# Patient Record
Sex: Male | Born: 1975 | Race: White | Hispanic: No | Marital: Single | Smoking: Never smoker
Health system: Southern US, Community
[De-identification: ages and names within clinical notes are randomized; demographics above are authoritative.]

## PROBLEM LIST (undated history)

## (undated) DIAGNOSIS — E785 Hyperlipidemia, unspecified: Secondary | ICD-10-CM

## (undated) DIAGNOSIS — I10 Essential (primary) hypertension: Secondary | ICD-10-CM

---

## 2004-01-30 ENCOUNTER — Ambulatory Visit: Payer: Self-pay | Admitting: Family Medicine

## 2004-05-03 ENCOUNTER — Ambulatory Visit: Payer: Self-pay | Admitting: Family Medicine

## 2004-07-01 ENCOUNTER — Ambulatory Visit: Payer: Self-pay | Admitting: Family Medicine

## 2004-10-25 ENCOUNTER — Emergency Department (HOSPITAL_COMMUNITY): Admission: EM | Admit: 2004-10-25 | Discharge: 2004-10-25 | Payer: Self-pay | Admitting: Emergency Medicine

## 2006-03-15 ENCOUNTER — Ambulatory Visit: Payer: Self-pay | Admitting: Family Medicine

## 2006-04-11 ENCOUNTER — Ambulatory Visit: Payer: Self-pay | Admitting: Family Medicine

## 2008-08-21 ENCOUNTER — Emergency Department (HOSPITAL_COMMUNITY): Admission: EM | Admit: 2008-08-21 | Discharge: 2008-08-21 | Payer: Self-pay | Admitting: Emergency Medicine

## 2010-04-18 LAB — URINALYSIS, ROUTINE W REFLEX MICROSCOPIC
Bilirubin Urine: NEGATIVE
Glucose, UA: NEGATIVE mg/dL
Ketones, ur: 40 mg/dL — AB
Leukocytes, UA: NEGATIVE
Nitrite: NEGATIVE
Protein, ur: 100 mg/dL — AB
Specific Gravity, Urine: >1.03 — ABNORMAL HIGH (ref 1.005–1.030)
Urobilinogen, UA: 0.2 mg/dL (ref 0.0–1.0)
pH: 6 (ref 5.0–8.0)

## 2010-04-18 LAB — DIFFERENTIAL
Basophils Absolute: 0 10*3/uL (ref 0.0–0.1)
Basophils Relative: 0 % (ref 0–1)
Eosinophils Absolute: 0.1 10*3/uL (ref 0.0–0.7)
Eosinophils Relative: 1 % (ref 0–5)
Lymphocytes Relative: 14 % (ref 12–46)
Lymphs Abs: 1.7 10*3/uL (ref 0.7–4.0)
Monocytes Absolute: 0.4 10*3/uL (ref 0.1–1.0)
Monocytes Relative: 3 % (ref 3–12)
Neutro Abs: 10.4 10*3/uL — ABNORMAL HIGH (ref 1.7–7.7)
Neutrophils Relative %: 82 % — ABNORMAL HIGH (ref 43–77)

## 2010-04-18 LAB — CBC
HCT: 45.7 % (ref 39.0–52.0)
Hemoglobin: 15.7 g/dL (ref 13.0–17.0)
MCHC: 34.3 g/dL (ref 30.0–36.0)
MCV: 86.5 fL (ref 78.0–100.0)
Platelets: 293 10*3/uL (ref 150–400)
RBC: 5.28 MIL/uL (ref 4.22–5.81)
RDW: 13.4 % (ref 11.5–15.5)
WBC: 12.6 10*3/uL — ABNORMAL HIGH (ref 4.0–10.5)

## 2010-04-18 LAB — BASIC METABOLIC PANEL
BUN: 10 mg/dL (ref 6–23)
CO2: 30 mEq/L (ref 19–32)
Calcium: 10 mg/dL (ref 8.4–10.5)
Chloride: 105 mEq/L (ref 96–112)
Creatinine, Ser: 1.16 mg/dL (ref 0.4–1.5)
GFR calc Af Amer: >60 mL/min (ref >60)
GFR calc non Af Amer: >60 mL/min (ref >60)
Glucose, Bld: 138 mg/dL — ABNORMAL HIGH (ref 70–99)
Potassium: 3.9 mEq/L (ref 3.5–5.1)
Sodium: 141 mEq/L (ref 135–145)

## 2010-04-18 LAB — URINE MICROSCOPIC-ADD ON

## 2018-03-14 ENCOUNTER — Encounter (HOSPITAL_COMMUNITY): Payer: Self-pay

## 2018-03-14 ENCOUNTER — Emergency Department (HOSPITAL_COMMUNITY): Payer: BLUE CROSS/BLUE SHIELD

## 2018-03-14 ENCOUNTER — Emergency Department (HOSPITAL_COMMUNITY)
Admission: EM | Admit: 2018-03-14 | Discharge: 2018-03-14 | Disposition: A | Discharge status: Home / Self Care | Payer: BLUE CROSS/BLUE SHIELD | Attending: Emergency Medicine | Admitting: Emergency Medicine

## 2018-03-14 ENCOUNTER — Other Ambulatory Visit: Payer: Self-pay

## 2018-03-14 DIAGNOSIS — I10 Essential (primary) hypertension: Secondary | ICD-10-CM | POA: Diagnosis not present

## 2018-03-14 DIAGNOSIS — R202 Paresthesia of skin: Secondary | ICD-10-CM | POA: Diagnosis not present

## 2018-03-14 DIAGNOSIS — Z79899 Other long term (current) drug therapy: Secondary | ICD-10-CM | POA: Insufficient documentation

## 2018-03-14 DIAGNOSIS — R079 Chest pain, unspecified: Secondary | ICD-10-CM | POA: Diagnosis not present

## 2018-03-14 DIAGNOSIS — R519 Headache, unspecified: Secondary | ICD-10-CM

## 2018-03-14 DIAGNOSIS — R51 Headache: Secondary | ICD-10-CM | POA: Diagnosis not present

## 2018-03-14 HISTORY — DX: Hyperlipidemia, unspecified: E78.5

## 2018-03-14 HISTORY — DX: Essential (primary) hypertension: I10

## 2018-03-14 LAB — CBC
HCT: 48.4 % (ref 39.0–52.0)
Hemoglobin: 15.6 g/dL (ref 13.0–17.0)
MCH: 27.7 pg (ref 26.0–34.0)
MCHC: 32.2 g/dL (ref 30.0–36.0)
MCV: 86 fL (ref 80.0–100.0)
Platelets: 309 K/uL (ref 150–400)
RBC: 5.63 MIL/uL (ref 4.22–5.81)
RDW: 12.6 % (ref 11.5–15.5)
WBC: 6.5 K/uL (ref 4.0–10.5)
nRBC: 0 % (ref 0.0–0.2)

## 2018-03-14 LAB — BASIC METABOLIC PANEL WITH GFR
Anion gap: 10 (ref 5–15)
BUN: 13 mg/dL (ref 6–20)
CO2: 26 mmol/L (ref 22–32)
Calcium: 9.4 mg/dL (ref 8.9–10.3)
Chloride: 101 mmol/L (ref 98–111)
Creatinine, Ser: 0.89 mg/dL (ref 0.61–1.24)
GFR calc Af Amer: >60 mL/min
GFR calc non Af Amer: >60 mL/min
Glucose, Bld: 126 mg/dL — ABNORMAL HIGH (ref 70–99)
Potassium: 4.2 mmol/L (ref 3.5–5.1)
Sodium: 137 mmol/L (ref 135–145)

## 2018-03-14 LAB — TROPONIN I: Troponin I: <0.03 ng/mL (ref <0.03)

## 2018-03-14 MED ORDER — KETOROLAC TROMETHAMINE 30 MG/ML IJ SOLN
15.0000 mg | Freq: Once | INTRAMUSCULAR | Status: AC
  Administered 2018-03-14: 15 mg via INTRAVENOUS
  Filled 2018-03-14: qty 1

## 2018-03-14 MED ORDER — PROMETHAZINE HCL 25 MG/ML IJ SOLN
12.5000 mg | Freq: Once | INTRAMUSCULAR | Status: AC
  Administered 2018-03-14: 12.5 mg via INTRAVENOUS
  Filled 2018-03-14: qty 1

## 2018-03-14 MED ORDER — SODIUM CHLORIDE 0.9% FLUSH
3.0000 mL | Freq: Once | INTRAVENOUS | Status: AC
  Administered 2018-03-14: 3 mL via INTRAVENOUS

## 2018-03-14 NOTE — ED Triage Notes (Addendum)
Pt reports he woke up approx 0530 am with chest pain and facial pain. Chest hurts under left breast area. Left arm feels numb. Pt neuro's wnl

## 2018-03-14 NOTE — ED Provider Notes (Signed)
[facility] EMERGENCY DEPARTMENT Provider Note   CSN: [id number] Arrival date & time: 03/14/18  1208    History   Chief Complaint Chief Complaint  Patient presents with  . Chest Pain  . Facial Pain    HPI Barry Stevens is a 43 y.o. male.     HPI Patient presents with chest pain and facial pain.  States the pain hurts on the left side of his head.  Throbbing.  Dull.  States the left face also feels numb.  Has not had headaches like this before.  No trauma.  No vision changes.  No fevers. Also has left mid chest pain.  Also dull.  No difficulty with breathing.  No fevers.  No cough.  No shortness of breath.  States he has some numbness in his left arm but states this is chronic.  No swelling in his legs.  History of hyperlipidemia and high cholesterol.  Past Medical History:  Diagnosis Date  . Hyperlipidemia   . Hypertension     There are no active problems to display for this patient.   History reviewed. No pertinent surgical history.      Home Medications    Prior to Admission medications   Medication Sig Start Date End Date Taking? Authorizing Provider  atorvastatin (LIPITOR) 40 MG tablet Take 1 tablet by mouth daily. 01/12/18  Yes [provider]  diclofenac sodium (VOLTAREN) 1 % GEL Place 1 application onto the skin 2 (two) times daily. 10/23/17  Yes [provider]  olmesartan-hydrochlorothiazide (BENICAR HCT) 20-12.5 MG tablet Take 1 tablet by mouth daily. 03/13/18  Yes [provider]  omeprazole (PRILOSEC) 20 MG capsule Take 1 capsule by mouth 2 (two) times daily. 01/12/18  Yes [provider]    Family History No family history on file.  Social History Social History   Tobacco Use  . Smoking status: Never Smoker  Substance Use Topics  . Alcohol use: Yes    Comment: drinks daily jack and coke and budlight  . Drug use: Never     Allergies   Patient has no known allergies.   Review of Systems Review of Systems    Constitutional: Negative for appetite change.  HENT: Negative for congestion.   Respiratory: Negative for shortness of breath.   Cardiovascular: Positive for chest pain.  Gastrointestinal: Negative for abdominal distention and abdominal pain.  Genitourinary: Negative for flank pain.  Musculoskeletal: Positive for neck pain.  Skin: Negative for rash.  Neurological: Positive for numbness and headaches. Negative for weakness.  Psychiatric/Behavioral: Negative for behavioral problems.     Physical Exam Updated Vital Signs BP 126/83 (BP Location: Left Arm)   Pulse 69   Temp 98.3 F (36.8 C) (Oral)   Resp 20   Ht 5' 10"  (1.778 m)   Wt 115.7 kg   SpO2 97%   BMI 36.59 kg/m   Physical Exam Vitals signs and nursing note reviewed.  Constitutional:      Appearance: He is well-developed.  HENT:     Head: Normocephalic.  Eyes:     Extraocular Movements: Extraocular movements intact.     Pupils: Pupils are equal, round, and reactive to light.  Neck:     Musculoskeletal: Neck supple.  Cardiovascular:     Rate and Rhythm: Normal rate and regular rhythm.     Heart sounds: No murmur.  Pulmonary:     Effort: Pulmonary effort is normal.     Breath sounds: No wheezing, rhonchi or rales.  Musculoskeletal:     Right lower leg: No edema.  Skin:    General: Skin is warm.     Capillary Refill: Capillary refill takes less than 2 seconds.  Neurological:     Mental Status: He is alert.     Comments: Paresthesia to left upper and mid face.  Eye movements intact.  Good grip strength bilaterally.  Sensation intact in both upper extremities.  Good strength in face.      ED Treatments / Results  Labs (all labs ordered are listed, but only abnormal results are displayed) Labs Reviewed  BASIC METABOLIC PANEL - Abnormal; Notable for the following components:      Result Value   Glucose, Bld 126 (*)    All other components within normal limits  CBC  TROPONIN I  TROPONIN I    EKG EKG  Interpretation  Date/Time:  Wednesday March 14 2018 12:16:34 EST Ventricular Rate:  90 PR Interval:  132 QRS Duration: 80 QT Interval:  366 QTC Calculation: 447 R Axis:   58 Text Interpretation:  Normal sinus rhythm Normal ECG Confirmed by Davonna Belling [phone]) on 03/14/2018 2:06:15 PM   Radiology Dg Chest 2 View  Result Date: 03/14/2018 CLINICAL DATA:  Left-sided chest pain. EXAM: CHEST - 2 VIEW COMPARISON:  None. FINDINGS: The heart size and mediastinal contours are within normal limits. Both lungs are clear. The visualized skeletal structures are unremarkable. IMPRESSION: Normal exam. Electronically Signed   By: Lorriane Shire M.D.   On: 03/14/2018 12:56    Procedures Procedures (including critical care time)  Medications Ordered in ED Medications  sodium chloride flush (NS) 0.9 % injection 3 mL (3 mLs Intravenous Given 03/14/18 1452)  ketorolac (TORADOL) 30 MG/ML injection 15 mg (15 mg Intravenous Given 03/14/18 1451)  promethazine (PHENERGAN) injection 12.5 mg (12.5 mg Intravenous Given 03/14/18 1452)     Initial Impression / Assessment and Plan / ED Course  I have reviewed the triage vital signs and the nursing notes.  Pertinent labs & imaging results that were available during my care of the patient were reviewed by me and considered in my medical decision making (see chart for details).        Patient with headache facial paresthesias/pain.  Also chest pain.  Work-up overall reassuring.  Enzymes negative initially was second pending.  Head CT to be done due to headache with possible left neuro deficits in upper face.  However I do not think that he needs admission for stroke work-up.  Treated his migraine.  Likely discharge home.  Care turned over to Dr. Laverta Baltimore.  Final Clinical Impressions(s) / ED Diagnoses   Final diagnoses:  Nonspecific chest pain  Nonintractable headache, unspecified chronicity pattern, unspecified headache type  Paresthesia    ED Discharge Orders     None       Davonna Belling, MD 03/14/18 1512

## 2018-03-14 NOTE — Discharge Instructions (Addendum)
Call the Neurologist listed along with your PCP to schedule a follow up appointment. Return to the ED with any new or worsening symptoms.

## 2018-03-14 NOTE — ED Provider Notes (Signed)
Blood pressure 126/83, pulse 69, temperature 98.3 F (36.8 C), temperature source Oral, resp. rate 20, height 5' 10"  (1.778 m), weight 115.7 kg, SpO2 97 %.  Assuming care from Dr. Alvino Chapel.  In short, Barry Stevens is a 43 y.o. male with a chief complaint of Chest Pain and Facial Pain .  Refer to the original H&P for additional details.  The current plan of care is to f/u after CT head and repeat troponin.   04:20 PM CT imaging reviewed with no acute findings.  Repeat troponin is negative.  Reevaluated the patient is feeling well.  Neurological exam is unremarkable.  I have provided information for the patient at discharge including his PCPs contact information along with a local neurologist.  Discussed emergency department return precautions in detail.   EKG Interpretation  Date/Time:  Wednesday March 14 2018 12:16:34 EST Ventricular Rate:  90 PR Interval:  132 QRS Duration: 80 QT Interval:  366 QTC Calculation: 447 R Axis:   58 Text Interpretation:  Normal sinus rhythm Normal ECG Confirmed by Davonna Belling [phone] on 03/14/2018 2:06:15 PM          Long, W[facility], MD 03/14/18 1626

## 2019-12-12 IMAGING — CT CT HEAD W/O CM
3 series · 16 of 47 positions shown, 19 images · non-contrast
Comparison: None.

CLINICAL DATA: Chest pain, facial pain, left-sided

EXAM:
CT HEAD WITHOUT CONTRAST
TECHNIQUE: Contiguous axial images were obtained from the base of the skull
through the vertex without intravenous contrast.

[Series 2: head trauma wo · axial · 0.44mm/px · z∈[+1560,+1705]mm · 10 of 35 slices shown, 13 images]
[im 3/35  brain]
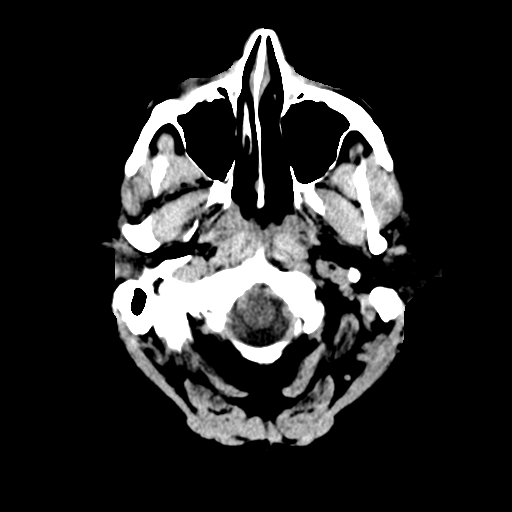
[im 3/35  bone]
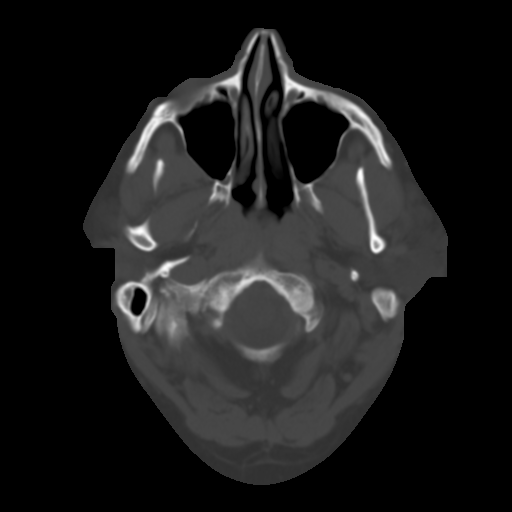
[im 6/35  brain]
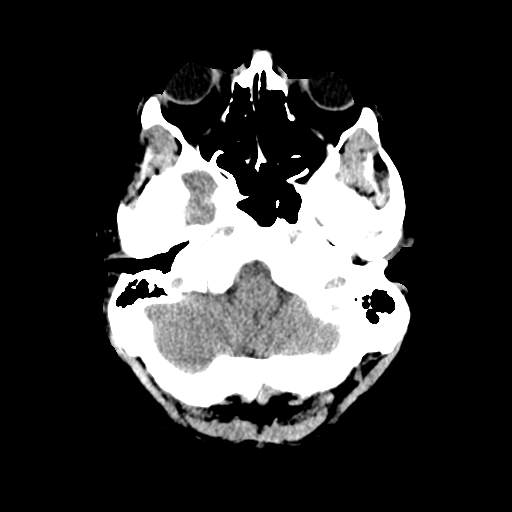
[im 10/35  brain]
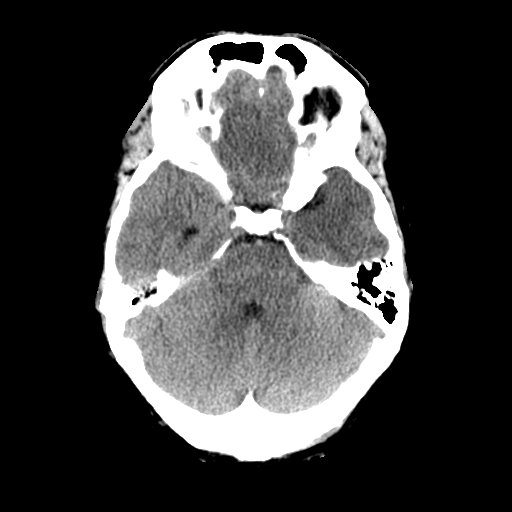
[im 12/35  brain]
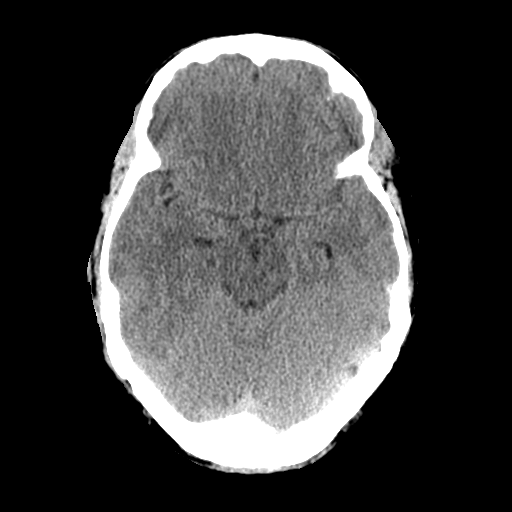
[im 16/35  brain]
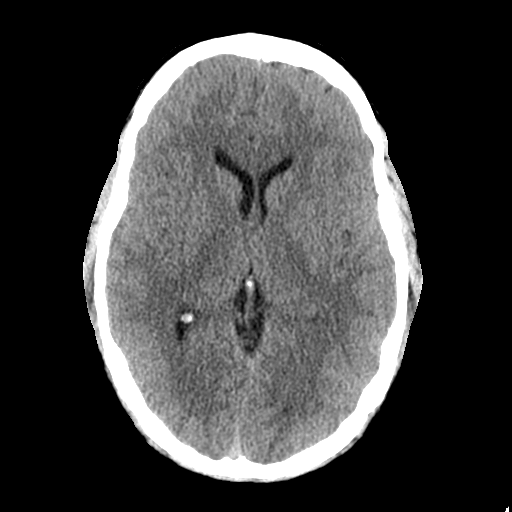
[im 16/35  bone]
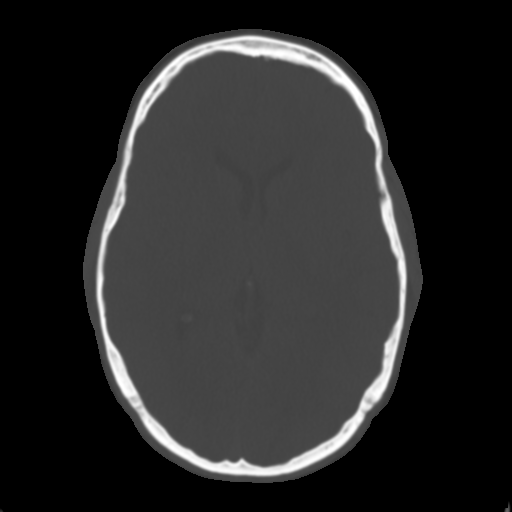
[im 19/35  brain]
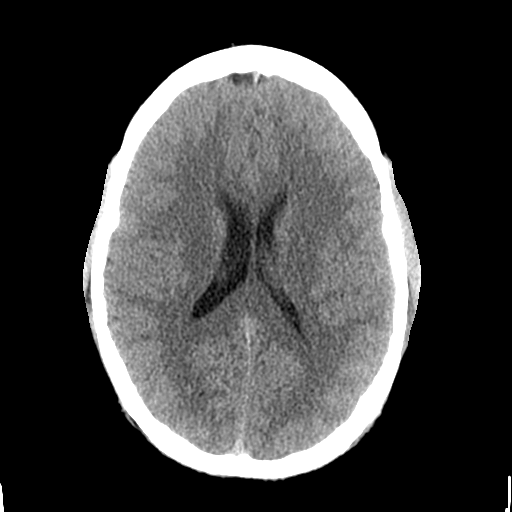
[im 23/35  brain]
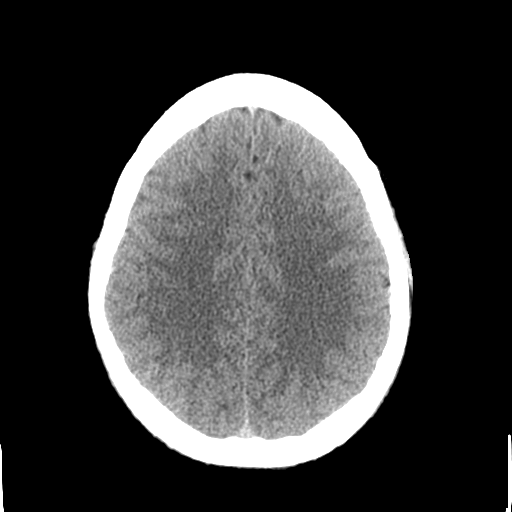
[im 26/35  brain]
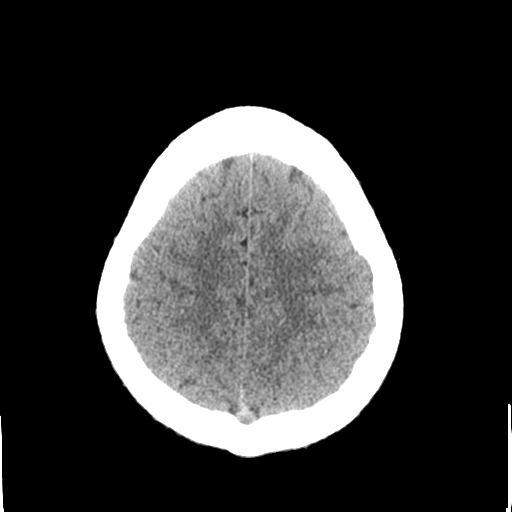
[im 29/35  brain]
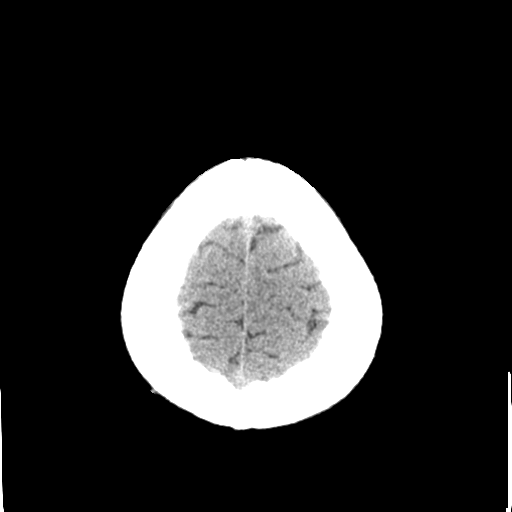
[im 29/35  bone]
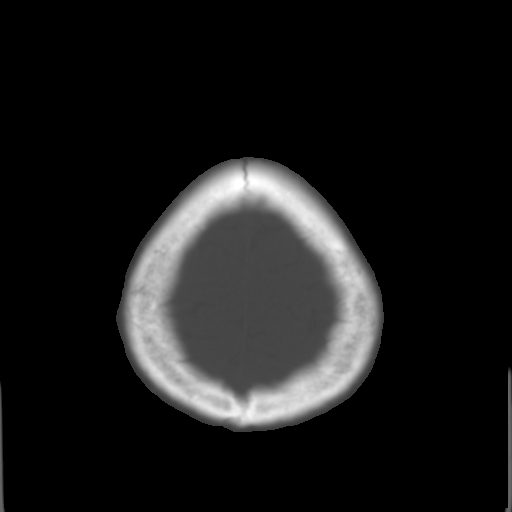
[im 32/35  brain]
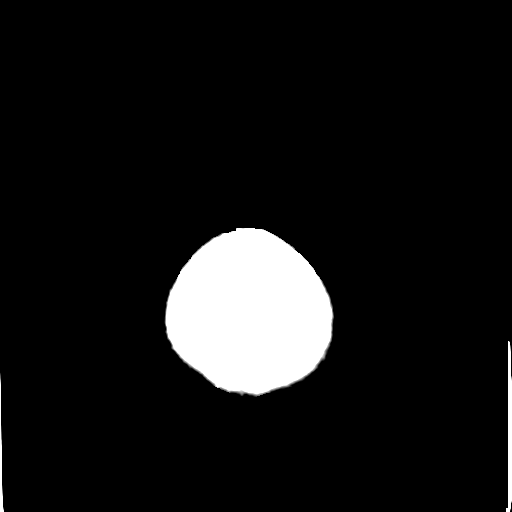

[Series 4: coronal soft tissue · coronal · 0.35mm/px · 3 of 70 slices shown]
[im 24/70  brain]
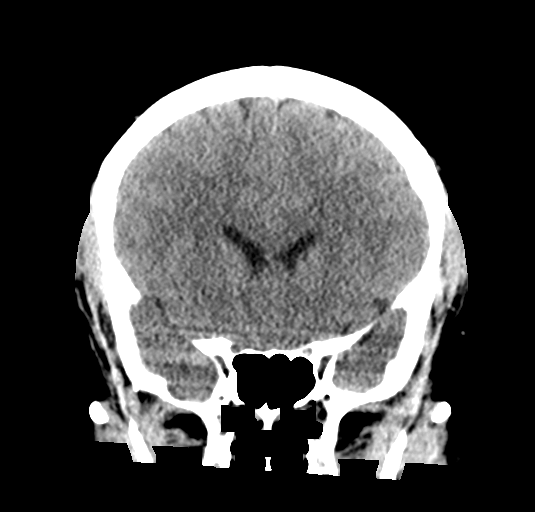
[im 31/70  brain]
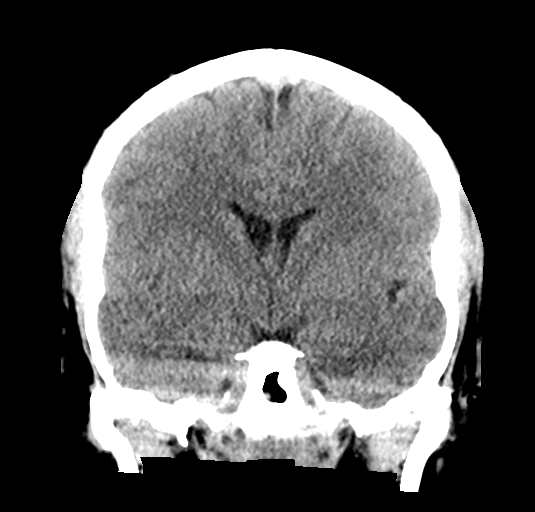
[im 39/70  brain]
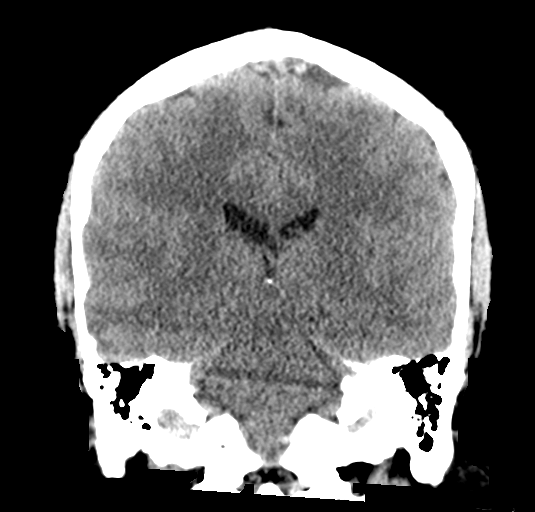

[Series 5: sagittal soft tissue · sagittal · 0.35mm/px · 3 of 63 slices shown]
[im 21/63  brain]
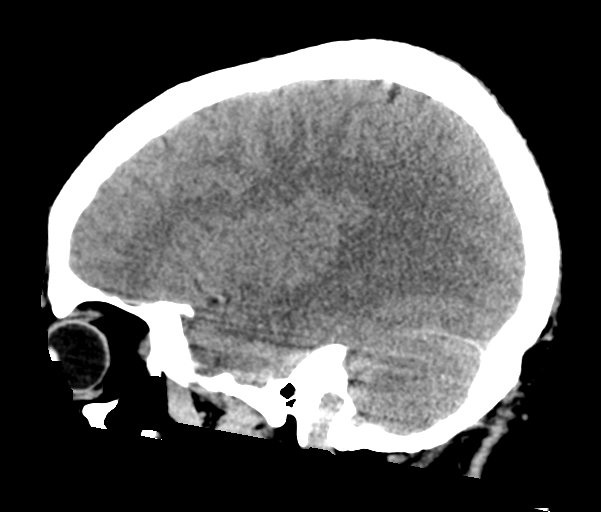
[im 32/63  brain]
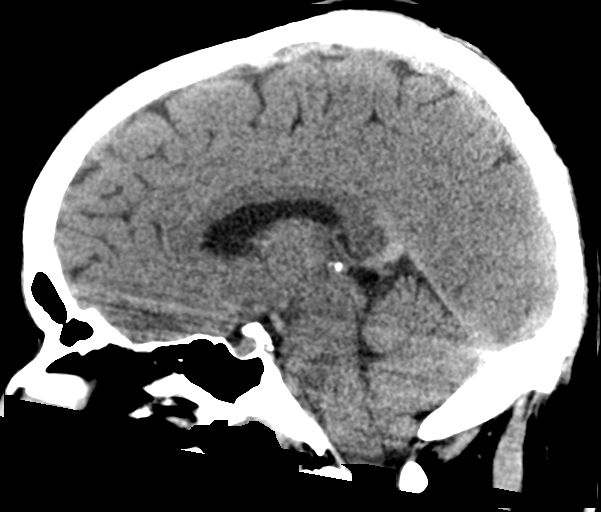
[im 42/63  brain]
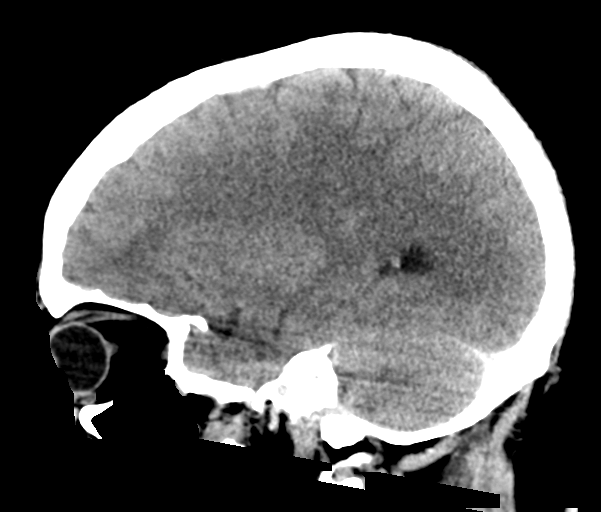

[16 of 47 positions shown; findings below may reference images not displayed]

FINDINGS: Brain: No evidence of acute infarction, hemorrhage, hydrocephalus,
extra-axial collection or mass lesion/mass effect.

Vascular: No hyperdense vessel or unexpected calcification.

Skull: Normal. Negative for fracture or focal lesion.

Sinuses/Orbits: No acute finding.

Other: None.
IMPRESSION: No acute intracranial pathology. No non-contrast CT findings to
explain left-sided facial pain.

## 2019-12-12 IMAGING — DX DG CHEST 2V
2 series · 2 of 2 positions shown · non-contrast
Comparison: None.

CLINICAL DATA: Left-sided chest pain.

EXAM:
CHEST - 2 VIEW

[chest pa]
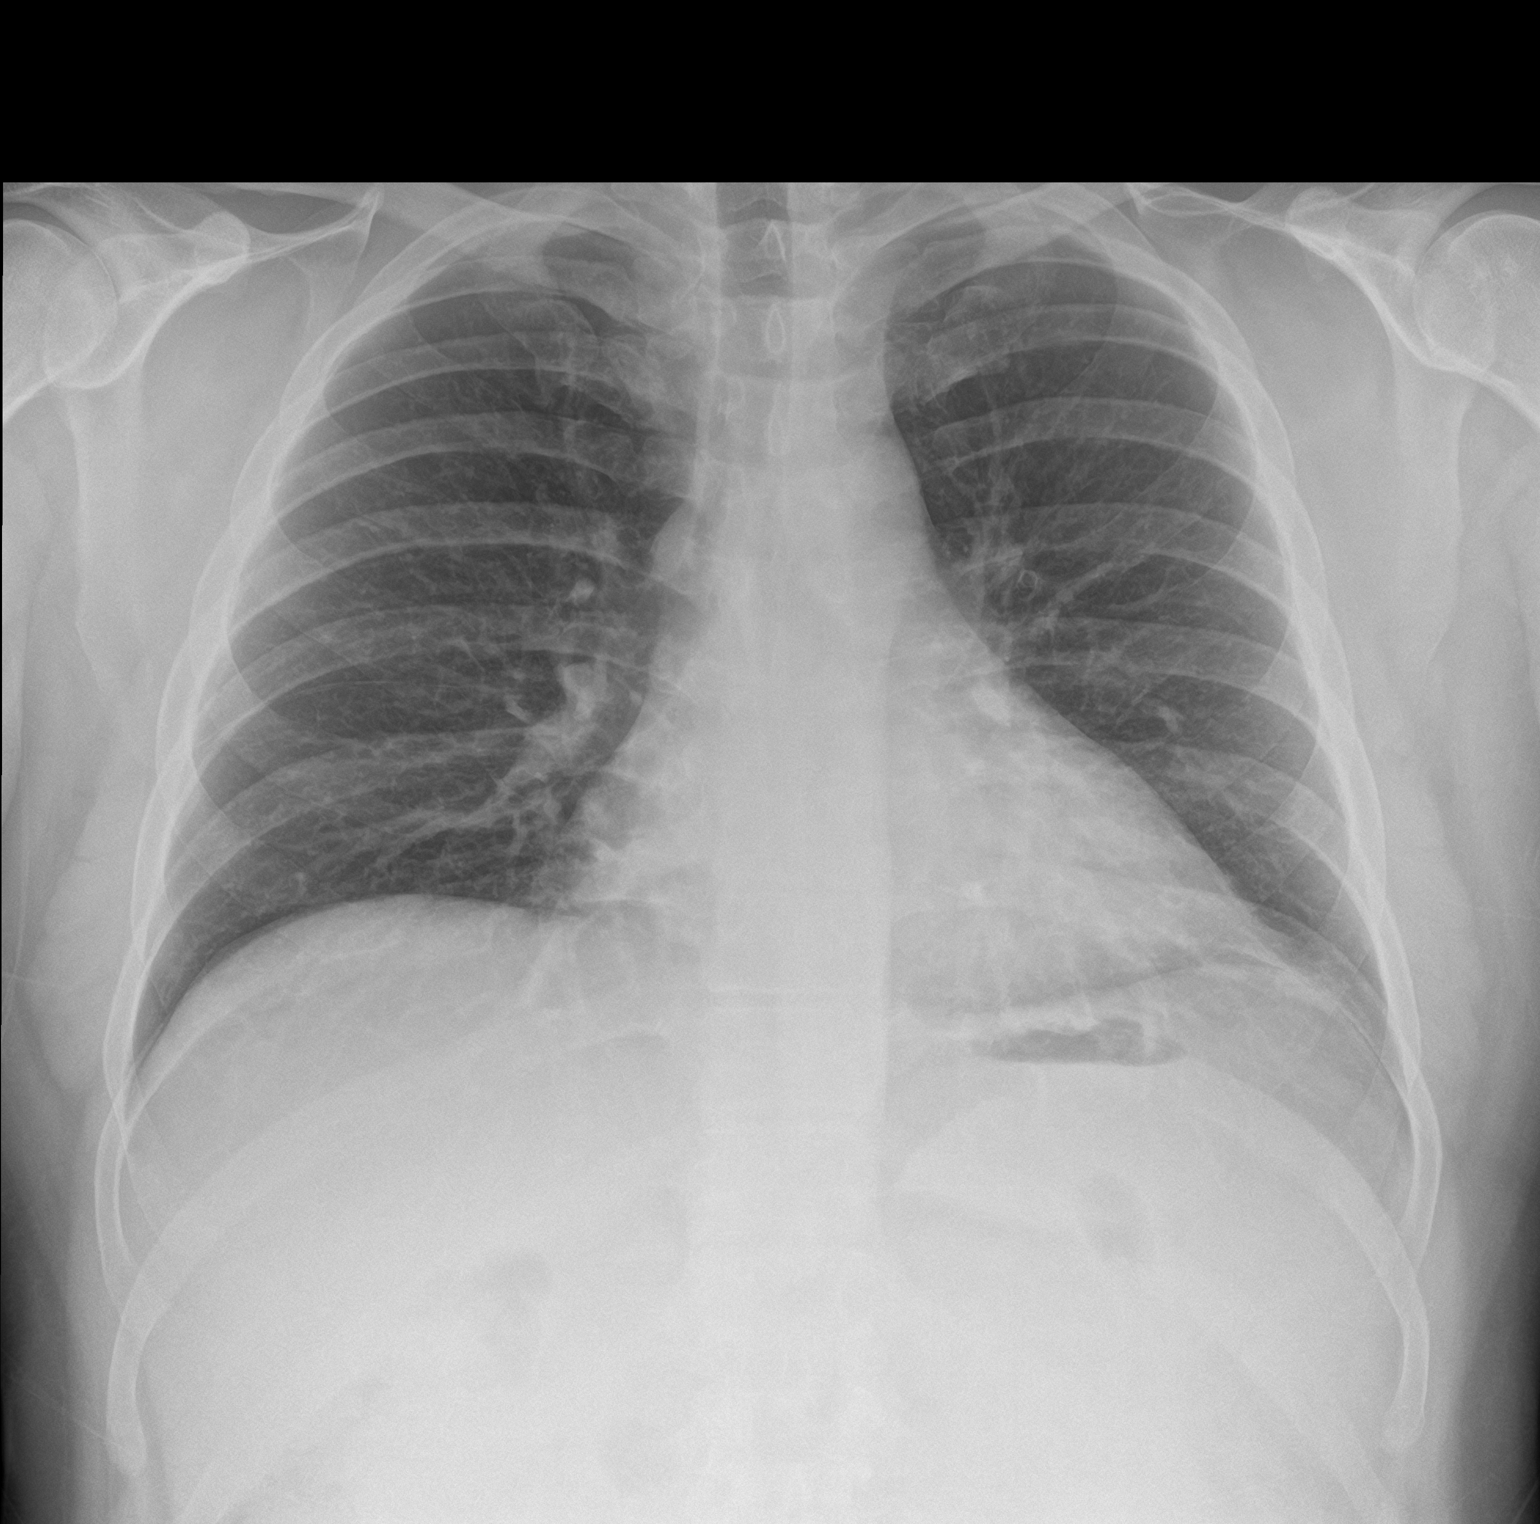

[chest lat]
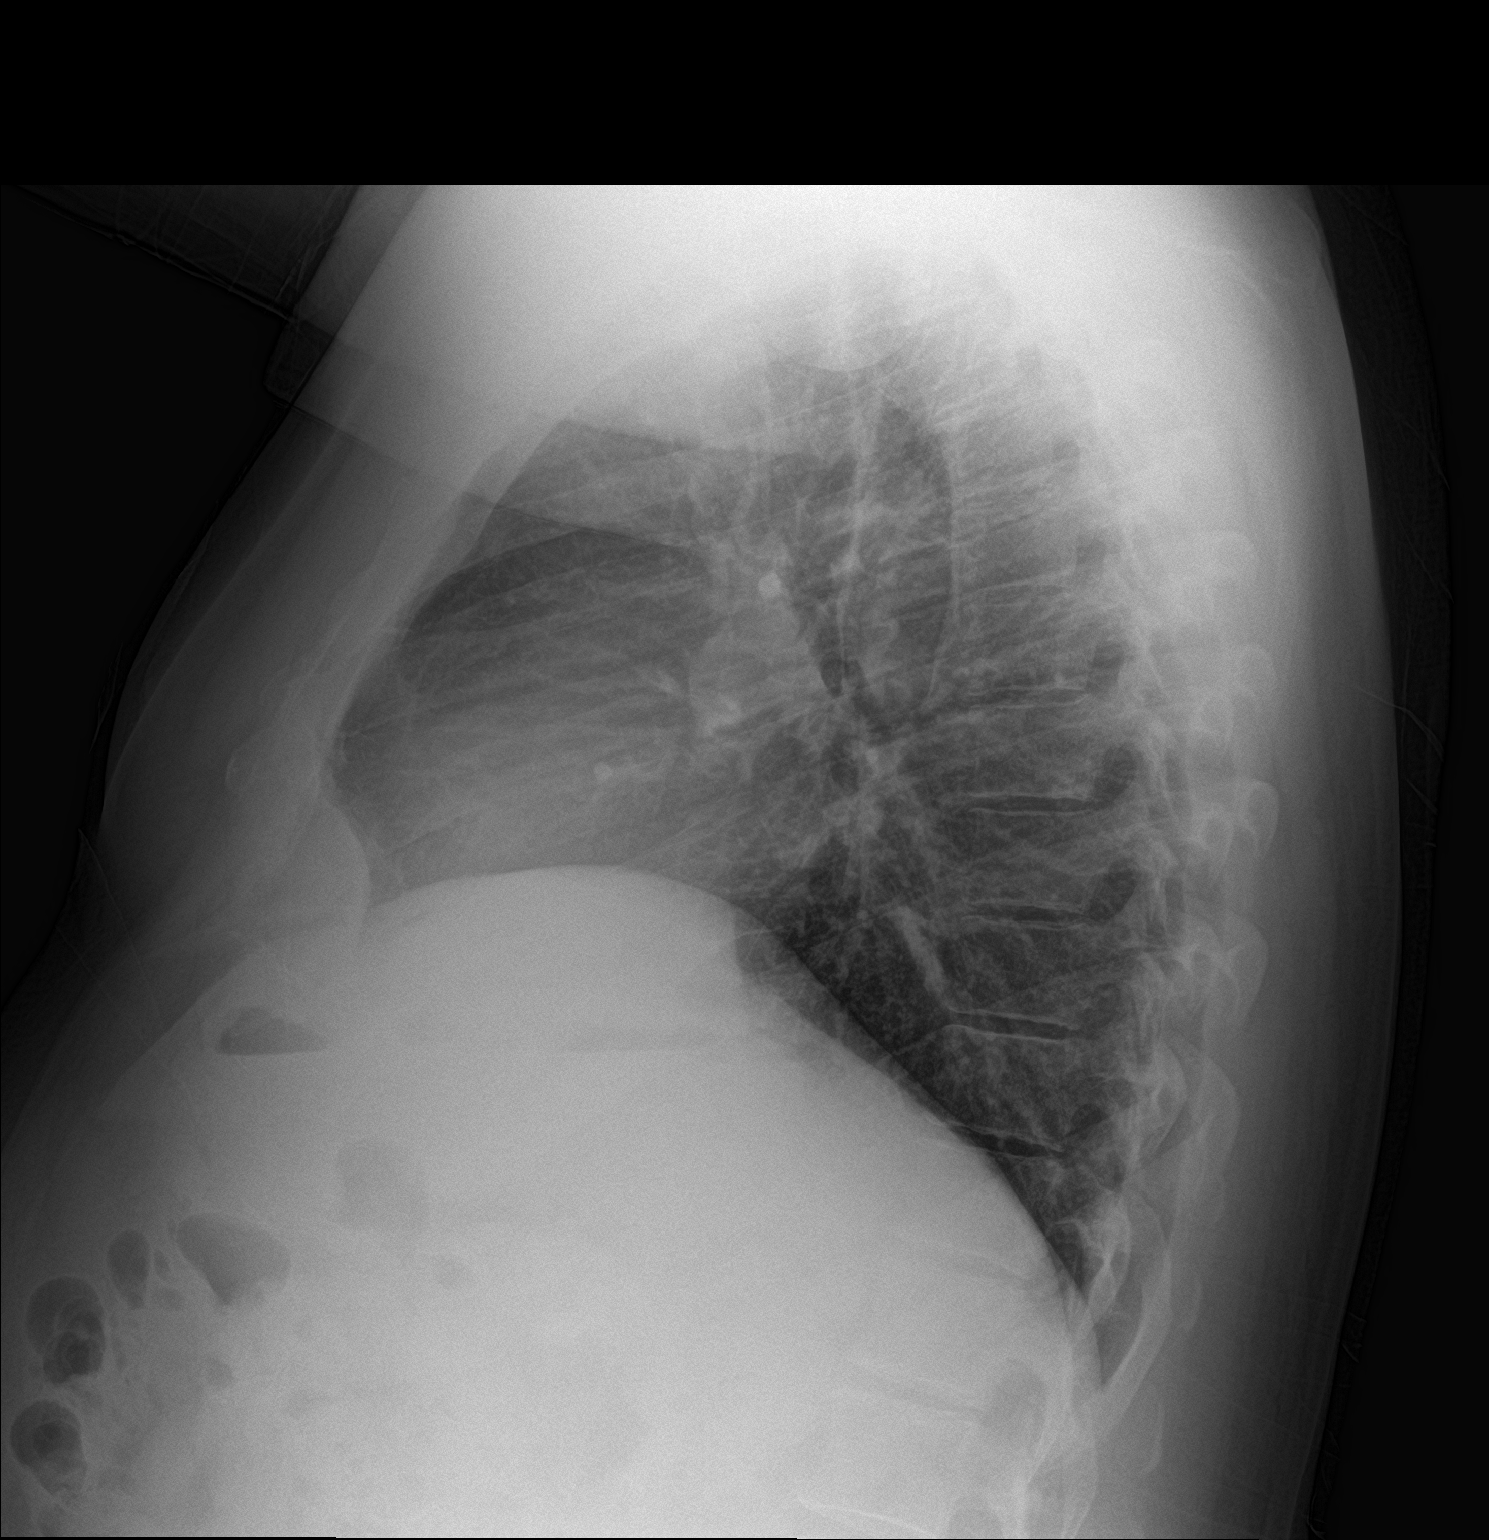

[2 of 2 positions shown; findings below may reference images not displayed]

FINDINGS: The heart size and mediastinal contours are within normal limits.
Both lungs are clear. The visualized skeletal structures are
unremarkable.
IMPRESSION: Normal exam.
